# Patient Record
Sex: Male | Born: 1985 | Race: White | Hispanic: No | Marital: Single
Health system: Midwestern US, Academic
[De-identification: ages and names within clinical notes are randomized; demographics above are authoritative.]

---

## 2017-07-29 ENCOUNTER — Encounter: Admit: 2017-07-29 | Discharge: 2017-07-29

## 2017-07-29 DIAGNOSIS — K4090 Unilateral inguinal hernia, without obstruction or gangrene, not specified as recurrent: Principal | ICD-10-CM

## 2017-07-30 ENCOUNTER — Encounter: Admit: 2017-07-30 | Discharge: 2017-07-30

## 2017-07-30 ENCOUNTER — Emergency Department: Admit: 2017-07-30 | Discharge: 2017-07-30 | Disposition: A | Discharge status: Home / Self Care

## 2017-07-30 ENCOUNTER — Emergency Department: Admit: 2017-07-30 | Discharge: 2017-07-30

## 2017-07-30 DIAGNOSIS — K4090 Unilateral inguinal hernia, without obstruction or gangrene, not specified as recurrent: Principal | ICD-10-CM

## 2017-07-30 LAB — URINALYSIS DIPSTICK

## 2017-07-30 LAB — CBC AND DIFF
Lab: 0 % (ref >60)
Lab: 0 10*3/uL (ref 0–0.20)
Lab: 0.2 10*3/uL (ref 0–0.45)
Lab: 0.9 10*3/uL — ABNORMAL HIGH (ref 0–0.80)
Lab: 13 % (ref 11–15)
Lab: 13 % — ABNORMAL HIGH (ref 4–12)
Lab: 14 g/dL — ABNORMAL HIGH (ref 13.5–16.5)
Lab: 2 % (ref 0–5)
Lab: 2.1 10*3/uL (ref 1.0–4.8)
Lab: 257 K/UL (ref 150–400)
Lab: 29 pg (ref 26–34)
Lab: 32 g/dL (ref 32.0–36.0)
Lab: 4.2 10*3/uL (ref >60)
Lab: 4.8 M/UL (ref 4.4–5.5)
Lab: 44 % (ref 40–50)
Lab: 56 % (ref 41–77)
Lab: 7.4 10*3/uL — AB (ref 4.5–11.0)
Lab: 8 FL (ref 7–11)
Lab: 91 FL (ref 80–100)

## 2017-07-30 LAB — URINALYSIS, MICROSCOPIC

## 2017-07-30 LAB — COMPREHENSIVE METABOLIC PANEL: Lab: 139 MMOL/L (ref 137–147)

## 2017-07-30 LAB — POC LACTATE: Lab: 0.6 MMOL/L (ref 0.5–2.0)

## 2017-07-30 MED ORDER — OXYCODONE-ACETAMINOPHEN 5-325 MG PO TAB
1 | ORAL_TABLET | ORAL | 0 refills | 2.00000 days | Status: AC | PRN
Start: 2017-07-30 — End: 2017-09-11

## 2017-07-30 MED ORDER — SODIUM CHLORIDE 0.9 % IJ SOLN
50 mL | Freq: Once | INTRAVENOUS | 0 refills | Status: CP
Start: 2017-07-30 — End: ?
  Administered 2017-07-30: 10:00:00 50 mL via INTRAVENOUS

## 2017-07-30 MED ORDER — FENTANYL CITRATE (PF) 50 MCG/ML IJ SOLN
50 ug | Freq: Once | INTRAVENOUS | 0 refills | Status: CP
Start: 2017-07-30 — End: ?
  Administered 2017-07-30: 09:00:00 50 ug via INTRAVENOUS

## 2017-07-30 MED ORDER — IOPAMIDOL 76 % IV SOLN
100 mL | Freq: Once | INTRAVENOUS | 0 refills | Status: CP
Start: 2017-07-30 — End: ?
  Administered 2017-07-30: 10:00:00 100 mL via INTRAVENOUS

## 2017-07-30 NOTE — ED Notes
Pt to CT

## 2017-07-30 NOTE — ED Notes
Pt discharged home with instructions and prescriptions, IV removed. Pt is AOX4 at this time and has no questions about discharge. Pt ambulates without difficulty. Pt's mom is here to drive him home. Pt verbalizes understanding of not being able to drive for 24 hours after having narcotic pain medication.

## 2017-09-11 ENCOUNTER — Encounter: Admit: 2017-09-11 | Discharge: 2017-09-11

## 2017-09-11 ENCOUNTER — Ambulatory Visit: Admit: 2017-09-11 | Discharge: 2017-09-12

## 2017-09-11 DIAGNOSIS — K4090 Unilateral inguinal hernia, without obstruction or gangrene, not specified as recurrent: Principal | ICD-10-CM

## 2017-09-11 MED ORDER — CEFAZOLIN INJ 1GM IVP
2 g | Freq: Once | INTRAVENOUS | 0 refills | Status: CN
Start: 2017-09-11 — End: ?

## 2017-09-11 NOTE — Progress Notes
Date of Service: 09/11/2017    Subjective:          Micheal Lawrence is a 31 y.o. male.    History of Present Illness  Micheal Lawrence is a 31 yo male w/ no significant past medical hx here for surgical evaluation of right inguinal hernia. He first noticed it ~1 month ago when lifting a large tool box out of the back of his truck. He noticed a significant discomfort and pulling in his right groin though denies noticing a bulge. It has bothered him every day since and he has been doing heavy lifting but having to stop because of the pain. 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     Review of Systems   Constitutional: Positive for activity change. Negative for appetite change, chills, diaphoresis, fatigue and unexpected weight change.   HENT: Negative for congestion, dental problem, drooling, ear discharge, ear pain, facial swelling, hearing loss, mouth sores, nosebleeds, postnasal drip, rhinorrhea, sinus pressure, sneezing, sore throat, tinnitus, trouble swallowing and voice change.    Eyes: Negative for photophobia, pain, discharge, redness, itching and visual disturbance.   Respiratory: Negative for apnea, cough, choking, chest tightness, shortness of breath and wheezing.    Cardiovascular: Negative for chest pain, palpitations and leg swelling.   Gastrointestinal: Negative for abdominal distention, abdominal pain, anal bleeding, blood in stool, constipation, diarrhea, nausea and vomiting.   Genitourinary: Negative for decreased urine volume, difficulty urinating, discharge, dysuria, enuresis, flank pain, frequency, genital sores, hematuria, penile swelling, testicular pain and urgency.   Musculoskeletal: Positive for back pain. Negative for arthralgias, gait problem, joint swelling, myalgias, neck pain and neck stiffness.   Skin: Negative for color change, pallor, rash and wound. Neurological: Negative for dizziness, tremors, seizures, facial asymmetry, speech difficulty, weakness, light-headedness, numbness and headaches.   Hematological: Negative for adenopathy. Does not bruise/bleed easily.   Psychiatric/Behavioral: Negative for agitation, behavioral problems, confusion, decreased concentration, dysphoric mood, hallucinations, self-injury, sleep disturbance and suicidal ideas. The patient is not nervous/anxious and is not hyperactive.      History reviewed. No pertinent past medical history.  History reviewed. No pertinent surgical history.  History reviewed. No pertinent family history.  Social History     Socioeconomic History   ??? Marital status: Single     Spouse name: Not on file   ??? Number of children: Not on file   ??? Years of education: Not on file   ??? Highest education level: Not on file   Occupational History   ??? Not on file   Tobacco Use   ??? Smoking status: Current Every Day Smoker     Packs/day: 0.50     Years: 8.00     Pack years: 4.00     Types: Cigarettes   ??? Smokeless tobacco: Never Used   Substance and Sexual Activity   ??? Alcohol use: Yes     Frequency: 2-4 times a month     Comment: social   ??? Drug use: No   ??? Sexual activity: Not on file   Other Topics Concern   ??? Not on file   Social History Narrative   ??? Not on file     Objective:         No current outpatient medications on file.     Vitals:    09/11/17 1527   BP: 137/74   Pulse: 82   Resp: 16   Temp: 36.7 ???  C (98 ???F)   TempSrc: Oral   Weight: 86.6 kg (191 lb)   Height: 190.5 cm (75)     Body mass index is 23.87 kg/m???.     Physical Exam   Constitutional: He is oriented to person, place, and time. He appears well-developed and well-nourished. No distress.   HENT:   Head: Normocephalic and atraumatic.   Eyes: Conjunctivae and EOM are normal.   Neck: Normal range of motion.   Cardiovascular: Normal rate and regular rhythm.   Pulmonary/Chest: Effort normal and breath sounds normal. Abdominal: Soft. He exhibits no distension. A hernia is present. Hernia confirmed positive in the right inguinal area.   Genitourinary: Penis normal.   Genitourinary Comments: TTP   Musculoskeletal: Normal range of motion.   Neurological: He is alert and oriented to person, place, and time.   Skin: Skin is warm and dry.   Psychiatric: He has a normal mood and affect. His behavior is normal.     Assessment and Plan:  Micheal Lawrence is a 31 year old male with right inguinal hernia.     Discussed risks and benefits of surgery v doing nothing  Patient agreeable to proceed with right inguinal hernia repair  Discussed importance of not lifting heavily after the surgery and until that time  Scheduled for surgery 10/07/17    Boris Sharper, MD  Pager [phone]    ATTESTATION    I personally performed the key portions of the E/M visit, discussed case with resident and concur with resident documentation of history, physical exam, assessment, and treatment plan unless otherwise noted.     The procedure was described.  Risks, benefits, complications were described.  All questions were answered.   The patient wishes to proceed with surgery  Will schedule for surgery    Staff name:  Janine Ores, DO Date:  09/12/2017

## 2017-10-06 ENCOUNTER — Ambulatory Visit: Admit: 2017-10-06 | Discharge: 2017-10-06

## 2017-10-06 DIAGNOSIS — K4090 Unilateral inguinal hernia, without obstruction or gangrene, not specified as recurrent: Principal | ICD-10-CM

## 2017-10-07 ENCOUNTER — Encounter: Admit: 2017-10-07 | Discharge: 2017-10-07

## 2017-10-07 ENCOUNTER — Ambulatory Visit: Admit: 2017-10-07 | Discharge: 2017-10-07

## 2017-10-07 DIAGNOSIS — F17210 Nicotine dependence, cigarettes, uncomplicated: ICD-10-CM

## 2017-10-07 DIAGNOSIS — K4090 Unilateral inguinal hernia, without obstruction or gangrene, not specified as recurrent: Principal | ICD-10-CM

## 2017-10-07 DIAGNOSIS — M47812 Spondylosis without myelopathy or radiculopathy, cervical region: ICD-10-CM

## 2017-10-07 DIAGNOSIS — M419 Scoliosis, unspecified: ICD-10-CM

## 2017-10-07 DIAGNOSIS — M4850XA Collapsed vertebra, not elsewhere classified, site unspecified, initial encounter for fracture: Principal | ICD-10-CM

## 2017-10-07 MED ORDER — LIDOCAINE (PF) 200 MG/10 ML (2 %) IJ SYRG
0 refills | Status: DC
Start: 2017-10-07 — End: 2017-10-07
  Administered 2017-10-07: 16:00:00 100 mg via INTRAVENOUS

## 2017-10-07 MED ORDER — LIDOCAINE (PF) 10 MG/ML (1 %) IJ SOLN
.1-2 mL | INTRAMUSCULAR | 0 refills | Status: DC | PRN
Start: 2017-10-07 — End: 2017-10-07

## 2017-10-07 MED ORDER — FENTANYL CITRATE (PF) 50 MCG/ML IJ SOLN
50 ug | INTRAVENOUS | 0 refills | Status: DC | PRN
Start: 2017-10-07 — End: 2017-10-07

## 2017-10-07 MED ORDER — FENTANYL CITRATE (PF) 50 MCG/ML IJ SOLN
0 refills | Status: DC
Start: 2017-10-07 — End: 2017-10-07
  Administered 2017-10-07 (×2): 25 ug via INTRAVENOUS

## 2017-10-07 MED ORDER — MIDAZOLAM 1 MG/ML IJ SOLN
INTRAVENOUS | 0 refills | Status: DC
Start: 2017-10-07 — End: 2017-10-07
  Administered 2017-10-07: 16:00:00 2 mg via INTRAVENOUS

## 2017-10-07 MED ORDER — BUPIVACAINE 0.25 % (2.5 MG/ML) IJ SOLN
0 refills | Status: DC
Start: 2017-10-07 — End: 2017-10-07
  Administered 2017-10-07: 17:00:00 5 mL via INTRAMUSCULAR

## 2017-10-07 MED ORDER — DEXTRAN 70-HYPROMELLOSE (PF) 0.1-0.3 % OP DPET
0 refills | Status: DC
Start: 2017-10-07 — End: 2017-10-07
  Administered 2017-10-07: 16:00:00 2 [drp] via OPHTHALMIC

## 2017-10-07 MED ORDER — PROPOFOL INJ 10 MG/ML IV VIAL
0 refills | Status: DC
Start: 2017-10-07 — End: 2017-10-07
  Administered 2017-10-07: 16:00:00 300 mg via INTRAVENOUS

## 2017-10-07 MED ORDER — OXYCODONE 5 MG PO TAB
5-10 mg | Freq: Once | ORAL | 0 refills | Status: CP | PRN
Start: 2017-10-07 — End: ?
  Administered 2017-10-07: 18:00:00 10 mg via ORAL

## 2017-10-07 MED ORDER — FENTANYL CITRATE (PF) 50 MCG/ML IJ SOLN
25 ug | INTRAVENOUS | 0 refills | Status: DC | PRN
Start: 2017-10-07 — End: 2017-10-07
  Administered 2017-10-07 (×4): 25 ug via INTRAVENOUS

## 2017-10-07 MED ORDER — CEFAZOLIN INJ 1GM IVP
2 g | Freq: Once | INTRAVENOUS | 0 refills | Status: CP
Start: 2017-10-07 — End: ?
  Administered 2017-10-07: 16:00:00 2 g via INTRAVENOUS

## 2017-10-07 MED ORDER — LIDOCAINE HCL 20 MG/ML (2 %) IJ SOLN
0 refills | Status: DC
Start: 2017-10-07 — End: 2017-10-07
  Administered 2017-10-07: 17:00:00 5 mL via INTRAMUSCULAR

## 2017-10-07 MED ORDER — KETOROLAC 30 MG/ML (1 ML) IJ SOLN
30 mg | Freq: Once | INTRAVENOUS | 0 refills | Status: CP
Start: 2017-10-07 — End: ?
  Administered 2017-10-07: 18:00:00 30 mg via INTRAVENOUS

## 2017-10-07 MED ORDER — ONDANSETRON HCL (PF) 4 MG/2 ML IJ SOLN
INTRAVENOUS | 0 refills | Status: DC
Start: 2017-10-07 — End: 2017-10-07
  Administered 2017-10-07: 16:00:00 4 mg via INTRAVENOUS

## 2017-10-07 MED ORDER — HYDROMORPHONE (PF) 2 MG/ML IJ SYRG
.5-1 mg | INTRAVENOUS | 0 refills | Status: DC | PRN
Start: 2017-10-07 — End: 2017-10-07
  Administered 2017-10-07: 19:00:00 1 mg via INTRAVENOUS

## 2017-10-07 MED ORDER — ACETAMINOPHEN 1,000 MG/100 ML (10 MG/ML) IV SOLN
0 refills | Status: DC
Start: 2017-10-07 — End: 2017-10-07
  Administered 2017-10-07: 17:00:00 1000 mg via INTRAVENOUS

## 2017-10-07 MED ORDER — OXYCODONE 5 MG PO TAB
5 mg | ORAL_TABLET | ORAL | 0 refills | 6.00000 days | Status: AC | PRN
Start: 2017-10-07 — End: 2017-10-30
  Filled 2017-10-07: qty 20, 4d supply

## 2017-10-07 MED ORDER — PROMETHAZINE 25 MG/ML IJ SOLN
6.25 mg | INTRAVENOUS | 0 refills | Status: DC | PRN
Start: 2017-10-07 — End: 2017-10-07

## 2017-10-07 MED ORDER — LACTATED RINGERS IV SOLP
INTRAVENOUS | 0 refills | Status: DC
Start: 2017-10-07 — End: 2017-10-07
  Administered 2017-10-07 (×2): 1000.000 mL via INTRAVENOUS

## 2017-10-07 MED ORDER — DEXAMETHASONE SODIUM PHOSPHATE 4 MG/ML IJ SOLN
INTRAVENOUS | 0 refills | Status: DC
Start: 2017-10-07 — End: 2017-10-07
  Administered 2017-10-07: 16:00:00 4 mg via INTRAVENOUS

## 2017-10-07 NOTE — Interval H&P Note
History and Physical Update Note    Allergies:  Patient has no known allergies.    I have examined the patient, and the following changes are noted from the previous H&P performed on 09/11/17.    - To OR today for open right inguinal hernia repair with mesh  - Risk and benefits of operative procedure were discussed in detail with the patient, including, but not limited to, bleeding, infection, damage to surrounding structures, chronic pain, recurrence, and need for reoperation. After all questions were answered, the patient elected to proceed with surgery and informed consent was provided.    Janell Quiet, MD  Pager [phone]    --------------------------------------------------------------------------------------------------------------------------------------------

## 2017-10-07 NOTE — Other
Brief Operative Note    Name: Marckus Hanover is a 32 y.o. male     DOB: 11/21/1985             MRN#: [id number]  DATE OF OPERATION: 10/07/2017    Date:  10/07/2017        Preoperative Dx:   Right inguinal hernia [K40.90]    Post-op Diagnosis      * Right inguinal hernia [K40.90]    Procedure(s) (LRB):  OPEN REPAIR INGUINAL HERNIA AGE 35 YEARS OR OLDER - REDUCIBLE - WITH MESH PLACEMENT - RIGHT (Right)    Anesthesia Type: General    Surgeon(s) and Role:     * Kennedy Bucker, [place], DO - Primary     * Janell Quiet, MD - Resident - Assisting    Findings:  Right direct inguinal hernia, cord lipoma    Estimated Blood Loss: Minimal     Specimen(s) Removed/Disposition:   ID Type Source Tests Collected by Time Destination   1 : 1. RIGHT SIDED CORD LIPOMA Tissue Hernia SURGICAL PATHOLOGY          Janine Ores, DO 10/07/2017 1039        Complications:  None    Implants: Mesh    Drains: None    Disposition:  PACU - stable    Janell Quiet, MD  Pager [phone]

## 2017-10-09 ENCOUNTER — Encounter: Admit: 2017-10-09 | Discharge: 2017-10-09

## 2017-10-09 DIAGNOSIS — M419 Scoliosis, unspecified: ICD-10-CM

## 2017-10-09 DIAGNOSIS — M47812 Spondylosis without myelopathy or radiculopathy, cervical region: ICD-10-CM

## 2017-10-09 DIAGNOSIS — M4850XA Collapsed vertebra, not elsewhere classified, site unspecified, initial encounter for fracture: Principal | ICD-10-CM

## 2017-10-17 ENCOUNTER — Encounter: Admit: 2017-10-17 | Discharge: 2017-10-17

## 2017-10-30 ENCOUNTER — Encounter: Admit: 2017-10-30 | Discharge: 2017-10-30

## 2017-10-30 ENCOUNTER — Ambulatory Visit: Admit: 2017-10-30 | Discharge: 2017-10-30

## 2017-10-30 DIAGNOSIS — M419 Scoliosis, unspecified: ICD-10-CM

## 2017-10-30 DIAGNOSIS — M4850XA Collapsed vertebra, not elsewhere classified, site unspecified, initial encounter for fracture: Principal | ICD-10-CM

## 2017-10-30 DIAGNOSIS — M47812 Spondylosis without myelopathy or radiculopathy, cervical region: ICD-10-CM

## 2017-10-30 DIAGNOSIS — K4090 Unilateral inguinal hernia, without obstruction or gangrene, not specified as recurrent: Principal | ICD-10-CM

## 2017-11-27 ENCOUNTER — Ambulatory Visit: Admit: 2017-11-27 | Discharge: 2017-11-27

## 2017-11-27 ENCOUNTER — Encounter: Admit: 2017-11-27 | Discharge: 2017-11-27

## 2017-11-27 DIAGNOSIS — M47812 Spondylosis without myelopathy or radiculopathy, cervical region: ICD-10-CM

## 2017-11-27 DIAGNOSIS — M419 Scoliosis, unspecified: ICD-10-CM

## 2017-11-27 DIAGNOSIS — K4090 Unilateral inguinal hernia, without obstruction or gangrene, not specified as recurrent: Principal | ICD-10-CM

## 2017-11-27 DIAGNOSIS — M4850XA Collapsed vertebra, not elsewhere classified, site unspecified, initial encounter for fracture: Principal | ICD-10-CM

## 2017-12-26 ENCOUNTER — Encounter: Admit: 2017-12-26 | Discharge: 2017-12-26

## 2017-12-26 DIAGNOSIS — M47812 Spondylosis without myelopathy or radiculopathy, cervical region: ICD-10-CM

## 2017-12-26 DIAGNOSIS — M4850XA Collapsed vertebra, not elsewhere classified, site unspecified, initial encounter for fracture: Principal | ICD-10-CM

## 2017-12-26 DIAGNOSIS — M419 Scoliosis, unspecified: ICD-10-CM

## 2018-08-06 ENCOUNTER — Inpatient Hospital Stay: Admit: 2018-08-06 | Discharge: 2018-08-06

## 2018-08-06 ENCOUNTER — Encounter: Admit: 2018-08-06 | Discharge: 2018-08-06

## 2018-08-06 ENCOUNTER — Emergency Department: Admit: 2018-08-06 | Discharge: 2018-08-06 | Attending: Critical Care Medicine

## 2018-08-06 ENCOUNTER — Emergency Department: Admit: 2018-08-06 | Discharge: 2018-08-07 | Attending: Critical Care Medicine

## 2018-08-06 ENCOUNTER — Inpatient Hospital Stay
Admit: 2018-08-06 | Discharge: 2018-08-09 | Disposition: A | Discharge status: Home / Self Care | Payer: Private Health Insurance - Indemnity | Attending: Critical Care Medicine | Admitting: Critical Care Medicine

## 2018-08-06 ENCOUNTER — Ambulatory Visit: Admit: 2018-08-06 | Discharge: 2018-08-06

## 2018-08-06 DIAGNOSIS — M79605 Pain in left leg: ICD-10-CM

## 2018-08-06 DIAGNOSIS — Z7409 Other reduced mobility: Secondary | ICD-10-CM

## 2018-08-06 LAB — BASIC METABOLIC PANEL
Lab: 1.1 mg/dL (ref 0.4–1.24)
Lab: 139 MMOL/L (ref 137–147)
Lab: 18 mg/dL (ref 7–25)
Lab: 183 mg/dL — ABNORMAL HIGH (ref 70–100)
Lab: 6 % (ref 3–12)
Lab: 60 mL/min — ABNORMAL LOW (ref >60)
Lab: 8.6 mg/dL (ref 8.5–10.6)
Lab: >60 mL/min (ref >60)

## 2018-08-06 LAB — ALCOHOL LEVEL: Lab: <10 mg/dL (ref 21–30)

## 2018-08-06 LAB — PTT (APTT): Lab: 25 s (ref 24.0–36.5)

## 2018-08-06 LAB — PROTIME INR (PT): Lab: 1 M/UL (ref 0.8–1.2)

## 2018-08-06 LAB — CBC: Lab: 15 10*3/uL — ABNORMAL HIGH (ref 4.5–11.0)

## 2018-08-06 LAB — LACTIC ACID (BG - RAPID LACTATE): Lab: 1.5 MMOL/L (ref 0.5–2.0)

## 2018-08-06 LAB — CREATINE KINASE-CPK: Lab: 510 U/L — ABNORMAL HIGH (ref 35–232)

## 2018-08-06 MED ORDER — FENTANYL CITRATE (PF) 50 MCG/ML IJ SOLN
25-50 ug | INTRAVENOUS | 0 refills | Status: DC | PRN
Start: 2018-08-06 — End: 2018-08-07
  Administered 2018-08-06: 17:00:00 50 ug via INTRAVENOUS

## 2018-08-06 MED ORDER — LIDOCAINE (PF) 200 MG/10 ML (2 %) IJ SYRG
0 refills | Status: DC
Start: 2018-08-06 — End: 2018-08-06
  Administered 2018-08-06: 21:00:00 80 mg via INTRAVENOUS

## 2018-08-06 MED ORDER — FENTANYL CITRATE (PF) 50 MCG/ML IJ SOLN
25 ug | INTRAVENOUS | 0 refills | Status: DC | PRN
Start: 2018-08-06 — End: 2018-08-07

## 2018-08-06 MED ORDER — ONDANSETRON HCL (PF) 4 MG/2 ML IJ SOLN
INTRAVENOUS | 0 refills | Status: DC
Start: 2018-08-06 — End: 2018-08-06
  Administered 2018-08-06: 22:00:00 4 mg via INTRAVENOUS

## 2018-08-06 MED ORDER — OXYCODONE 5 MG PO TAB
5-10 mg | Freq: Once | ORAL | 0 refills | Status: CP | PRN
Start: 2018-08-06 — End: ?
  Administered 2018-08-07: 01:00:00 10 mg via ORAL

## 2018-08-06 MED ORDER — AMPICILLIN/SULBACTAM 3G/100ML NS IVPB (MB+)
3 g | INTRAVENOUS | 0 refills | Status: CP
Start: 2018-08-06 — End: ?
  Administered 2018-08-06 – 2018-08-07 (×6): 3 g via INTRAVENOUS

## 2018-08-06 MED ORDER — ONDANSETRON HCL (PF) 4 MG/2 ML IJ SOLN
4 mg | INTRAVENOUS | 0 refills | Status: DC | PRN
Start: 2018-08-06 — End: 2018-08-09

## 2018-08-06 MED ORDER — PROMETHAZINE 25 MG/ML IJ SOLN
6.25 mg | INTRAVENOUS | 0 refills | Status: DC | PRN
Start: 2018-08-06 — End: 2018-08-07

## 2018-08-06 MED ORDER — FENTANYL CITRATE (PF) 50 MCG/ML IJ SOLN
50 ug | Freq: Once | INTRAVENOUS | 0 refills | Status: CP
Start: 2018-08-06 — End: ?
  Administered 2018-08-06: 15:00:00 50 ug via INTRAVENOUS

## 2018-08-06 MED ORDER — SODIUM CHLORIDE 0.9 % IJ SOLN
50 mL | Freq: Once | INTRAVENOUS | 0 refills | Status: CP
Start: 2018-08-06 — End: ?
  Administered 2018-08-06: 15:00:00 50 mL via INTRAVENOUS

## 2018-08-06 MED ORDER — FENTANYL CITRATE (PF) 50 MCG/ML IJ SOLN
0 refills | Status: DC
Start: 2018-08-06 — End: 2018-08-06
  Administered 2018-08-06: 21:00:00 100 ug via INTRAVENOUS

## 2018-08-06 MED ORDER — DEXAMETHASONE SODIUM PHOSPHATE 4 MG/ML IJ SOLN
4 mg | Freq: Once | INTRAVENOUS | 0 refills | Status: AC | PRN
Start: 2018-08-06 — End: ?

## 2018-08-06 MED ORDER — HYDROMORPHONE (PF) 2 MG/ML IJ SYRG
1 mg | Freq: Once | INTRAVENOUS | 0 refills | Status: CP
Start: 2018-08-06 — End: ?
  Administered 2018-08-06: 18:00:00 1 mg via INTRAVENOUS

## 2018-08-06 MED ORDER — MIDAZOLAM 1 MG/ML IJ SOLN
INTRAVENOUS | 0 refills | Status: DC
Start: 2018-08-06 — End: 2018-08-06
  Administered 2018-08-06: 21:00:00 2 mg via INTRAVENOUS

## 2018-08-06 MED ORDER — IOHEXOL 350 MG IODINE/ML IV SOLN
100 mL | Freq: Once | INTRAVENOUS | 0 refills | Status: CP
Start: 2018-08-06 — End: ?
  Administered 2018-08-06: 15:00:00 100 mL via INTRAVENOUS

## 2018-08-06 MED ORDER — MEPERIDINE (PF) 25 MG/ML IJ SYRG
12.5 mg | INTRAVENOUS | 0 refills | Status: DC | PRN
Start: 2018-08-06 — End: 2018-08-07
  Administered 2018-08-07: 01:00:00 12.5 mg via INTRAVENOUS

## 2018-08-06 MED ORDER — ENOXAPARIN 30 MG/0.3 ML SC SYRG
30 mg | Freq: Two times a day (BID) | SUBCUTANEOUS | 0 refills | Status: DC
Start: 2018-08-06 — End: 2018-08-09
  Administered 2018-08-07 – 2018-08-08 (×3): 30 mg via SUBCUTANEOUS

## 2018-08-06 MED ORDER — HYDROMORPHONE (PF) 2 MG/ML IJ SYRG
0 refills | Status: DC
Start: 2018-08-06 — End: 2018-08-06
  Administered 2018-08-06: 22:00:00 .4 mg via INTRAVENOUS
  Administered 2018-08-06 (×2): .2 mg via INTRAVENOUS

## 2018-08-06 MED ORDER — SODIUM CHLORIDE 0.9 % IV SOLP
1000 mL | INTRAVENOUS | 0 refills | Status: DC
Start: 2018-08-06 — End: 2018-08-07
  Administered 2018-08-06 – 2018-08-07 (×2): 1000 mL via INTRAVENOUS

## 2018-08-06 MED ORDER — DEXTRAN 70-HYPROMELLOSE (PF) 0.1-0.3 % OP DPET
0 refills | Status: DC
Start: 2018-08-06 — End: 2018-08-06
  Administered 2018-08-06: 21:00:00 2 [drp] via OPHTHALMIC

## 2018-08-06 MED ORDER — OXYCODONE 5 MG PO TAB
5-15 mg | ORAL | 0 refills | Status: DC | PRN
Start: 2018-08-06 — End: 2018-08-09
  Administered 2018-08-07 (×2): 15 mg via ORAL
  Administered 2018-08-07: 07:00:00 10 mg via ORAL
  Administered 2018-08-07: 03:00:00 5 mg via ORAL
  Administered 2018-08-07 – 2018-08-09 (×7): 15 mg via ORAL

## 2018-08-06 MED ORDER — LACTATED RINGERS IV SOLP
INTRAVENOUS | 0 refills | Status: DC
Start: 2018-08-06 — End: 2018-08-07
  Administered 2018-08-06: 20:00:00 1000.000 mL via INTRAVENOUS

## 2018-08-06 MED ORDER — PROPOFOL INJ 10 MG/ML IV VIAL
0 refills | Status: DC
Start: 2018-08-06 — End: 2018-08-06
  Administered 2018-08-06: 21:00:00 200 mg via INTRAVENOUS

## 2018-08-06 MED ORDER — SUCCINYLCHOLINE CHLORIDE 20 MG/ML IJ SOLN
INTRAVENOUS | 0 refills | Status: DC
Start: 2018-08-06 — End: 2018-08-06
  Administered 2018-08-06: 21:00:00 120 mg via INTRAVENOUS

## 2018-08-06 MED ORDER — DEXAMETHASONE SODIUM PHOSPHATE 4 MG/ML IJ SOLN
INTRAVENOUS | 0 refills | Status: DC
Start: 2018-08-06 — End: 2018-08-06
  Administered 2018-08-06: 21:00:00 4 mg via INTRAVENOUS

## 2018-08-07 LAB — CBC
Lab: 12 g/dL — ABNORMAL LOW (ref 13.5–16.5)
Lab: 13 % (ref 11–15)
Lab: 210 10*3/uL (ref 150–400)
Lab: 30 pg (ref 26–34)
Lab: 33 g/dL (ref 32.0–36.0)
Lab: 38 % — ABNORMAL LOW (ref 40–50)
Lab: 4.2 M/UL — ABNORMAL LOW (ref 4.4–5.5)
Lab: 7.7 FL (ref 7–11)
Lab: 89 FL (ref 80–100)
Lab: 9.1 10*3/uL (ref 4.5–11.0)

## 2018-08-07 MED ORDER — SENNOSIDES-DOCUSATE SODIUM 8.6-50 MG PO TAB
1 | Freq: Two times a day (BID) | ORAL | 0 refills | Status: DC
Start: 2018-08-07 — End: 2018-08-09
  Administered 2018-08-07 – 2018-08-08 (×3): 1 via ORAL

## 2018-08-07 MED ORDER — METHOCARBAMOL 500 MG PO TAB
750 mg | Freq: Two times a day (BID) | ORAL | 0 refills | Status: DC
Start: 2018-08-07 — End: 2018-08-09
  Administered 2018-08-07 – 2018-08-08 (×3): 750 mg via ORAL

## 2018-08-07 MED ORDER — POLYETHYLENE GLYCOL 3350 17 GRAM PO PWPK
1 | Freq: Every day | ORAL | 0 refills | Status: DC
Start: 2018-08-07 — End: 2018-08-09
  Administered 2018-08-07 – 2018-08-08 (×2): 17 g via ORAL

## 2018-08-07 MED ORDER — ACETAMINOPHEN 325 MG PO TAB
650 mg | ORAL | 0 refills | Status: DC | PRN
Start: 2018-08-07 — End: 2018-08-09
  Administered 2018-08-07 – 2018-08-08 (×2): 650 mg via ORAL

## 2018-08-07 MED ORDER — FLU VACC QS2019-20 6MOS UP(PF) 60 MCG (15 MCG X 4)/0.5 ML IM SYRG
.5 mL | Freq: Once | INTRAMUSCULAR | 0 refills | Status: CP
Start: 2018-08-07 — End: ?
  Administered 2018-08-08: 21:00:00 0.5 mL via INTRAMUSCULAR

## 2018-08-08 ENCOUNTER — Encounter: Admit: 2018-08-08 | Discharge: 2018-08-08

## 2018-08-08 LAB — MAGNESIUM: Lab: 1.8 mg/dL — ABNORMAL LOW (ref 1.6–2.6)

## 2018-08-08 LAB — PHOSPHORUS: Lab: 3 mg/dL — ABNORMAL LOW (ref >60)

## 2018-08-08 LAB — CBC: Lab: 6.6 10*3/uL — ABNORMAL HIGH (ref 4.5–11.0)

## 2018-08-08 LAB — BASIC METABOLIC PANEL: Lab: 139 MMOL/L — ABNORMAL LOW (ref 137–147)

## 2018-08-08 MED ORDER — OXYCODONE 5 MG PO TAB
5-10 mg | ORAL_TABLET | ORAL | 0 refills | 6.00000 days | Status: AC | PRN
Start: 2018-08-08 — End: 2018-08-26
  Filled 2018-08-08 (×2): qty 50, 5d supply, fill #1

## 2018-08-08 MED ORDER — METHOCARBAMOL 750 MG PO TAB
750 mg | ORAL_TABLET | Freq: Two times a day (BID) | ORAL | 0 refills | Status: AC
Start: 2018-08-08 — End: 2018-08-27
  Filled 2018-08-08 (×2): qty 30, 15d supply, fill #1

## 2018-08-08 MED ORDER — ACETAMINOPHEN 325 MG PO TAB
650 mg | ORAL | 0 refills | Status: DC | PRN
Start: 2018-08-08 — End: 2019-11-30

## 2018-08-08 MED ORDER — POLYETHYLENE GLYCOL 3350 17 GRAM PO PWPK
17 g | Freq: Every day | ORAL | 0 refills | 18.00000 days | Status: DC
Start: 2018-08-08 — End: 2019-11-30

## 2018-08-08 MED ORDER — SENNOSIDES-DOCUSATE SODIUM 8.6-50 MG PO TAB
1 | Freq: Two times a day (BID) | ORAL | 0 refills | Status: DC
Start: 2018-08-08 — End: 2019-11-10

## 2018-08-08 MED ORDER — ENOXAPARIN 40 MG/0.4 ML SC SYRG
40 mg | Freq: Every day | SUBCUTANEOUS | 0 refills | 7.00000 days | Status: AC
Start: 2018-08-08 — End: ?
  Filled 2018-08-08 (×2): qty 21, 21d supply, fill #1

## 2018-08-09 ENCOUNTER — Emergency Department: Admit: 2018-08-06 | Discharge: 2018-08-06 | Attending: Critical Care Medicine

## 2018-08-09 ENCOUNTER — Inpatient Hospital Stay: Admit: 2018-08-06 | Discharge: 2018-08-06

## 2018-08-09 ENCOUNTER — Emergency Department: Admit: 2018-08-06 | Discharge: 2018-08-07 | Attending: Critical Care Medicine

## 2018-08-09 DIAGNOSIS — R40241 Glasgow coma scale score 13-15, unspecified time: ICD-10-CM

## 2018-08-09 DIAGNOSIS — S82492C Other fracture of shaft of left fibula, initial encounter for open fracture type IIIA, IIIB, or IIIC: Principal | ICD-10-CM

## 2018-08-09 DIAGNOSIS — Z23 Encounter for immunization: ICD-10-CM

## 2018-08-09 DIAGNOSIS — F1590 Other stimulant use, unspecified, uncomplicated: ICD-10-CM

## 2018-08-09 DIAGNOSIS — D72829 Elevated white blood cell count, unspecified: ICD-10-CM

## 2018-08-09 DIAGNOSIS — S92355A Nondisplaced fracture of fifth metatarsal bone, left foot, initial encounter for closed fracture: ICD-10-CM

## 2018-08-09 DIAGNOSIS — S92345A Nondisplaced fracture of fourth metatarsal bone, left foot, initial encounter for closed fracture: ICD-10-CM

## 2018-08-09 DIAGNOSIS — S27321A Contusion of lung, unilateral, initial encounter: ICD-10-CM

## 2018-08-09 DIAGNOSIS — Z7409 Other reduced mobility: ICD-10-CM

## 2018-08-09 DIAGNOSIS — G8911 Acute pain due to trauma: ICD-10-CM

## 2018-08-15 ENCOUNTER — Encounter: Admit: 2018-08-15 | Discharge: 2018-08-15

## 2018-08-15 DIAGNOSIS — M419 Scoliosis, unspecified: ICD-10-CM

## 2018-08-15 DIAGNOSIS — M47812 Spondylosis without myelopathy or radiculopathy, cervical region: ICD-10-CM

## 2018-08-15 DIAGNOSIS — M4850XA Collapsed vertebra, not elsewhere classified, site unspecified, initial encounter for fracture: Principal | ICD-10-CM

## 2018-08-26 ENCOUNTER — Ambulatory Visit: Admit: 2018-08-26 | Discharge: 2018-08-27 | Payer: BC Managed Care – PPO

## 2018-08-26 ENCOUNTER — Encounter: Admit: 2018-08-26 | Discharge: 2018-08-26

## 2018-08-26 DIAGNOSIS — M419 Scoliosis, unspecified: ICD-10-CM

## 2018-08-26 DIAGNOSIS — M4850XA Collapsed vertebra, not elsewhere classified, site unspecified, initial encounter for fracture: Principal | ICD-10-CM

## 2018-08-26 DIAGNOSIS — S82452F Displaced comminuted fracture of shaft of left fibula, subsequent encounter for open fracture type IIIA, IIIB, or IIIC with routine healing: Principal | ICD-10-CM

## 2018-08-26 DIAGNOSIS — M47812 Spondylosis without myelopathy or radiculopathy, cervical region: ICD-10-CM

## 2018-08-26 MED ORDER — OXYCODONE 5 MG PO TAB
5-10 mg | ORAL_TABLET | ORAL | 0 refills | 6.00000 days | Status: AC | PRN
Start: 2018-08-26 — End: 2018-09-02

## 2018-08-27 ENCOUNTER — Encounter: Admit: 2018-08-27 | Discharge: 2018-08-27

## 2018-08-27 MED ORDER — METHOCARBAMOL 750 MG PO TAB
750 mg | ORAL_TABLET | Freq: Two times a day (BID) | ORAL | 0 refills | Status: AC
Start: 2018-08-27 — End: 2019-11-30

## 2018-09-01 ENCOUNTER — Encounter: Admit: 2018-09-01 | Discharge: 2018-09-01

## 2018-09-01 DIAGNOSIS — S82452F Displaced comminuted fracture of shaft of left fibula, subsequent encounter for open fracture type IIIA, IIIB, or IIIC with routine healing: Principal | ICD-10-CM

## 2018-09-02 ENCOUNTER — Encounter: Admit: 2018-09-02 | Discharge: 2018-09-02

## 2018-09-02 ENCOUNTER — Ambulatory Visit: Admit: 2018-09-02 | Discharge: 2018-09-02

## 2018-09-02 DIAGNOSIS — S82452F Displaced comminuted fracture of shaft of left fibula, subsequent encounter for open fracture type IIIA, IIIB, or IIIC with routine healing: Principal | ICD-10-CM

## 2018-09-02 DIAGNOSIS — M4850XA Collapsed vertebra, not elsewhere classified, site unspecified, initial encounter for fracture: Principal | ICD-10-CM

## 2018-09-02 DIAGNOSIS — M419 Scoliosis, unspecified: ICD-10-CM

## 2018-09-02 DIAGNOSIS — M47812 Spondylosis without myelopathy or radiculopathy, cervical region: ICD-10-CM

## 2018-09-16 ENCOUNTER — Encounter: Admit: 2018-09-16 | Discharge: 2018-09-16

## 2018-09-16 DIAGNOSIS — S82452F Displaced comminuted fracture of shaft of left fibula, subsequent encounter for open fracture type IIIA, IIIB, or IIIC with routine healing: Principal | ICD-10-CM

## 2018-09-18 ENCOUNTER — Encounter: Admit: 2018-09-18 | Discharge: 2018-09-18

## 2018-09-18 ENCOUNTER — Observation Stay: Admit: 2018-09-18 | Discharge: 2018-09-19 | Payer: BC Managed Care – PPO

## 2018-09-18 ENCOUNTER — Ambulatory Visit: Admit: 2018-09-18 | Discharge: 2018-09-18

## 2018-09-18 DIAGNOSIS — M47812 Spondylosis without myelopathy or radiculopathy, cervical region: ICD-10-CM

## 2018-09-18 DIAGNOSIS — R609 Edema, unspecified: ICD-10-CM

## 2018-09-18 DIAGNOSIS — M4850XA Collapsed vertebra, not elsewhere classified, site unspecified, initial encounter for fracture: Principal | ICD-10-CM

## 2018-09-18 DIAGNOSIS — M419 Scoliosis, unspecified: ICD-10-CM

## 2018-09-18 MED ORDER — METHOCARBAMOL 750 MG PO TAB
750 mg | Freq: Two times a day (BID) | ORAL | 0 refills | Status: DC
Start: 2018-09-18 — End: 2018-09-19
  Administered 2018-09-19 (×2): 750 mg via ORAL

## 2018-09-18 MED ORDER — ENOXAPARIN 100 MG/ML SC SYRG
90 mg | Freq: Two times a day (BID) | SUBCUTANEOUS | 0 refills | Status: DC
Start: 2018-09-18 — End: 2018-09-19
  Administered 2018-09-18 – 2018-09-19 (×2): 90 mg via SUBCUTANEOUS

## 2018-09-18 MED ORDER — ONDANSETRON HCL (PF) 4 MG/2 ML IJ SOLN
4 mg | INTRAVENOUS | 0 refills | Status: DC | PRN
Start: 2018-09-18 — End: 2018-09-19

## 2018-09-18 MED ORDER — MAGNESIUM HYDROXIDE 2,400 MG/10 ML PO SUSP
10 mL | Freq: Every day | ORAL | 0 refills | Status: DC
Start: 2018-09-18 — End: 2018-09-19
  Administered 2018-09-19: 02:00:00 10 mL via ORAL

## 2018-09-18 MED ORDER — ACETAMINOPHEN 325 MG PO TAB
650 mg | ORAL | 0 refills | Status: DC | PRN
Start: 2018-09-18 — End: 2018-09-19

## 2018-09-18 MED ORDER — ACETAMINOPHEN 650 MG RE SUPP
650 mg | RECTAL | 0 refills | Status: DC | PRN
Start: 2018-09-18 — End: 2018-09-19

## 2018-09-18 MED ORDER — DIPHENHYDRAMINE HCL 50 MG/ML IJ SOLN
25 mg | INTRAVENOUS | 0 refills | Status: DC | PRN
Start: 2018-09-18 — End: 2018-09-19

## 2018-09-18 MED ORDER — DIPHENHYDRAMINE HCL 25 MG PO CAP
25 mg | ORAL | 0 refills | Status: DC | PRN
Start: 2018-09-18 — End: 2018-09-19

## 2018-09-18 MED ORDER — ENOXAPARIN 300 MG/3 ML SC SOLN
85 mg | Freq: Two times a day (BID) | SUBCUTANEOUS | 0 refills | Status: DC
Start: 2018-09-18 — End: 2018-09-18

## 2018-09-18 MED ORDER — BISACODYL 10 MG RE SUPP
10 mg | Freq: Every day | RECTAL | 0 refills | Status: DC | PRN
Start: 2018-09-18 — End: 2018-09-19

## 2018-09-18 MED ORDER — DOCUSATE SODIUM 100 MG PO CAP
100 mg | Freq: Two times a day (BID) | ORAL | 0 refills | Status: DC
Start: 2018-09-18 — End: 2018-09-19
  Administered 2018-09-18 – 2018-09-19 (×2): 100 mg via ORAL

## 2018-09-18 MED ORDER — TRAMADOL 50 MG PO TAB
50 mg | ORAL | 0 refills | Status: DC | PRN
Start: 2018-09-18 — End: 2018-09-19

## 2018-09-19 ENCOUNTER — Encounter: Admit: 2018-09-19 | Discharge: 2018-09-19

## 2018-09-19 ENCOUNTER — Ambulatory Visit: Admit: 2018-09-18 | Discharge: 2018-09-18

## 2018-09-19 DIAGNOSIS — F1290 Cannabis use, unspecified, uncomplicated: ICD-10-CM

## 2018-09-19 DIAGNOSIS — S82402D Unspecified fracture of shaft of left fibula, subsequent encounter for closed fracture with routine healing: ICD-10-CM

## 2018-09-19 DIAGNOSIS — Z79899 Other long term (current) drug therapy: ICD-10-CM

## 2018-09-19 DIAGNOSIS — F1590 Other stimulant use, unspecified, uncomplicated: ICD-10-CM

## 2018-09-19 DIAGNOSIS — S82452F Displaced comminuted fracture of shaft of left fibula, subsequent encounter for open fracture type IIIA, IIIB, or IIIC with routine healing: ICD-10-CM

## 2018-09-19 DIAGNOSIS — S92912D Unspecified fracture of left toe(s), subsequent encounter for fracture with routine healing: ICD-10-CM

## 2018-09-19 DIAGNOSIS — M419 Scoliosis, unspecified: ICD-10-CM

## 2018-09-19 DIAGNOSIS — I82432 Acute embolism and thrombosis of left popliteal vein: ICD-10-CM

## 2018-09-19 DIAGNOSIS — Z0489 Encounter for examination and observation for other specified reasons: ICD-10-CM

## 2018-09-19 DIAGNOSIS — I82452 Acute embolism and thrombosis of left peroneal vein: Principal | ICD-10-CM

## 2018-09-19 DIAGNOSIS — Z87828 Personal history of other (healed) physical injury and trauma: ICD-10-CM

## 2018-09-19 DIAGNOSIS — F17210 Nicotine dependence, cigarettes, uncomplicated: ICD-10-CM

## 2018-09-19 LAB — PROTIME INR (PT): Lab: 1 mg/dL — CL (ref >60)

## 2018-09-19 LAB — PTT (APTT): Lab: 29 s — ABNORMAL LOW (ref 24.0–36.5)

## 2018-09-19 LAB — BASIC METABOLIC PANEL
Lab: 140 MMOL/L — ABNORMAL LOW (ref 137–147)
Lab: 26 MMOL/L — ABNORMAL HIGH (ref 21–30)

## 2018-09-19 LAB — CBC AND DIFF: Lab: 5.8 K/UL — ABNORMAL LOW (ref >60)

## 2018-09-19 MED ORDER — RIVAROXABAN 15 MG PO TAB
15 mg | ORAL_TABLET | Freq: Two times a day (BID) | ORAL | 0 refills | 30.00000 days | Status: AC
Start: 2018-09-19 — End: 2018-10-08
  Filled 2018-09-19: qty 42, 14d supply, fill #2
  Filled 2018-09-19 (×2): qty 42, 7d supply, fill #1
  Filled 2018-09-19: qty 42, 14d supply, fill #2

## 2018-09-19 MED ORDER — TRAMADOL 50 MG PO TAB
50 mg | ORAL_TABLET | ORAL | 0 refills | Status: AC | PRN
Start: 2018-09-19 — End: 2019-11-30
  Filled 2018-09-19 (×2): qty 10, 3d supply, fill #1

## 2018-09-19 MED ORDER — RIVAROXABAN 20 MG PO TAB
20 mg | ORAL_TABLET | Freq: Every day | ORAL | 0 refills | 30.00000 days | Status: AC
Start: 2018-09-19 — End: 2018-10-08

## 2018-09-21 ENCOUNTER — Encounter: Admit: 2018-09-21 | Discharge: 2018-09-21

## 2018-10-01 ENCOUNTER — Encounter: Admit: 2018-10-01 | Discharge: 2018-10-01

## 2018-10-01 DIAGNOSIS — S82452F Displaced comminuted fracture of shaft of left fibula, subsequent encounter for open fracture type IIIA, IIIB, or IIIC with routine healing: Secondary | ICD-10-CM

## 2018-10-02 ENCOUNTER — Encounter: Admit: 2018-10-02 | Discharge: 2018-10-02

## 2018-10-02 ENCOUNTER — Ambulatory Visit: Admit: 2018-10-02 | Discharge: 2018-10-02 | Payer: BC Managed Care – PPO

## 2018-10-02 ENCOUNTER — Ambulatory Visit: Admit: 2018-10-02 | Discharge: 2018-10-02

## 2018-10-02 DIAGNOSIS — S92902D Unspecified fracture of left foot, subsequent encounter for fracture with routine healing: Secondary | ICD-10-CM

## 2018-10-02 DIAGNOSIS — M419 Scoliosis, unspecified: Secondary | ICD-10-CM

## 2018-10-02 DIAGNOSIS — M47812 Spondylosis without myelopathy or radiculopathy, cervical region: Secondary | ICD-10-CM

## 2018-10-02 DIAGNOSIS — S82452F Displaced comminuted fracture of shaft of left fibula, subsequent encounter for open fracture type IIIA, IIIB, or IIIC with routine healing: Secondary | ICD-10-CM

## 2018-10-02 DIAGNOSIS — S82422D Displaced transverse fracture of shaft of left fibula, subsequent encounter for closed fracture with routine healing: Secondary | ICD-10-CM

## 2018-10-02 DIAGNOSIS — M4850XA Collapsed vertebra, not elsewhere classified, site unspecified, initial encounter for fracture: Secondary | ICD-10-CM

## 2018-10-08 ENCOUNTER — Encounter: Admit: 2018-10-08 | Discharge: 2018-10-08

## 2018-10-08 MED ORDER — RIVAROXABAN 20 MG PO TAB
20 mg | ORAL_TABLET | Freq: Every day | ORAL | 0 refills | 30.00000 days | Status: AC
Start: 2018-10-08 — End: ?

## 2018-11-12 ENCOUNTER — Encounter: Admit: 2018-11-12 | Discharge: 2018-11-12

## 2018-11-12 DIAGNOSIS — S92902D Unspecified fracture of left foot, subsequent encounter for fracture with routine healing: Principal | ICD-10-CM

## 2018-11-12 DIAGNOSIS — S82422D Displaced transverse fracture of shaft of left fibula, subsequent encounter for closed fracture with routine healing: Principal | ICD-10-CM

## 2018-11-20 ENCOUNTER — Ambulatory Visit: Admit: 2018-11-20 | Discharge: 2018-11-20

## 2018-11-20 ENCOUNTER — Ambulatory Visit: Admit: 2018-11-20 | Discharge: 2018-11-20 | Payer: BC Managed Care – PPO

## 2018-11-20 ENCOUNTER — Encounter: Admit: 2018-11-20 | Discharge: 2018-11-20

## 2018-11-20 DIAGNOSIS — M419 Scoliosis, unspecified: ICD-10-CM

## 2018-11-20 DIAGNOSIS — M47812 Spondylosis without myelopathy or radiculopathy, cervical region: ICD-10-CM

## 2018-11-20 DIAGNOSIS — S82422D Displaced transverse fracture of shaft of left fibula, subsequent encounter for closed fracture with routine healing: ICD-10-CM

## 2018-11-20 DIAGNOSIS — S92902D Unspecified fracture of left foot, subsequent encounter for fracture with routine healing: Principal | ICD-10-CM

## 2018-11-20 DIAGNOSIS — M4850XA Collapsed vertebra, not elsewhere classified, site unspecified, initial encounter for fracture: Principal | ICD-10-CM

## 2018-12-17 ENCOUNTER — Encounter: Admit: 2018-12-17 | Discharge: 2018-12-17

## 2018-12-17 NOTE — Telephone Encounter
Pt called requesting to rtw full duty. Work status reflecting this emailed to pt [email or web address].

## 2019-01-16 ENCOUNTER — Encounter: Admit: 2019-01-16 | Discharge: 2019-01-16

## 2019-01-16 NOTE — Telephone Encounter
Attempted to call pt to set up long distance xrays/telehealth visit Tuesday w/ Dr. Cherene Julian but pt phone no answer and states mailbox is full, cannot leave message. Will attempt to cb pt later today/monday.

## 2019-01-19 ENCOUNTER — Encounter: Admit: 2019-01-19 | Discharge: 2019-01-19

## 2019-01-19 NOTE — Telephone Encounter
Performed travel screening with patient.

## 2019-01-20 ENCOUNTER — Encounter: Admit: 2019-01-20 | Discharge: 2019-01-20

## 2019-01-20 ENCOUNTER — Ambulatory Visit: Admit: 2019-01-20 | Discharge: 2019-01-20

## 2019-01-20 ENCOUNTER — Ambulatory Visit: Admit: 2019-01-20 | Discharge: 2019-01-20 | Payer: BC Managed Care – PPO

## 2019-01-20 DIAGNOSIS — M419 Scoliosis, unspecified: ICD-10-CM

## 2019-01-20 DIAGNOSIS — S92902D Unspecified fracture of left foot, subsequent encounter for fracture with routine healing: Principal | ICD-10-CM

## 2019-01-20 DIAGNOSIS — S82422D Displaced transverse fracture of shaft of left fibula, subsequent encounter for closed fracture with routine healing: ICD-10-CM

## 2019-01-20 DIAGNOSIS — M47812 Spondylosis without myelopathy or radiculopathy, cervical region: ICD-10-CM

## 2019-01-20 DIAGNOSIS — M4850XA Collapsed vertebra, not elsewhere classified, site unspecified, initial encounter for fracture: Principal | ICD-10-CM
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
## 2019-11-09 ENCOUNTER — Encounter: Admit: 2019-11-09 | Discharge: 2019-11-09

## 2019-11-09 DIAGNOSIS — S92902D Unspecified fracture of left foot, subsequent encounter for fracture with routine healing: Secondary | ICD-10-CM

## 2019-11-09 DIAGNOSIS — S82422D Displaced transverse fracture of shaft of left fibula, subsequent encounter for closed fracture with routine healing: Secondary | ICD-10-CM

## 2019-11-10 ENCOUNTER — Ambulatory Visit: Admit: 2019-11-10 | Discharge: 2019-11-10

## 2019-11-10 ENCOUNTER — Encounter: Admit: 2019-11-10 | Discharge: 2019-11-10

## 2019-11-10 ENCOUNTER — Ambulatory Visit: Admit: 2019-11-10 | Discharge: 2019-11-10 | Payer: BC Managed Care – PPO

## 2019-11-10 DIAGNOSIS — S92902D Unspecified fracture of left foot, subsequent encounter for fracture with routine healing: Secondary | ICD-10-CM

## 2019-11-10 DIAGNOSIS — M542 Cervicalgia: Secondary | ICD-10-CM

## 2019-11-10 DIAGNOSIS — M47812 Spondylosis without myelopathy or radiculopathy, cervical region: Secondary | ICD-10-CM

## 2019-11-10 DIAGNOSIS — M4850XA Collapsed vertebra, not elsewhere classified, site unspecified, initial encounter for fracture: Secondary | ICD-10-CM

## 2019-11-10 DIAGNOSIS — S82422D Displaced transverse fracture of shaft of left fibula, subsequent encounter for closed fracture with routine healing: Secondary | ICD-10-CM

## 2019-11-10 DIAGNOSIS — M419 Scoliosis, unspecified: Secondary | ICD-10-CM
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
## 2019-11-26 ENCOUNTER — Encounter: Admit: 2019-11-26 | Discharge: 2019-11-26

## 2019-11-26 NOTE — Telephone Encounter
Pre Visit Planning - New Patient    Affected area of spine: neck    Referred by:     Imaging: MRI [facility]    Previous spine surgery: none    Spinal Injections: none    EMG/NCS. none    Records Requested: Yes    Orders have been pended

## 2019-11-29 ENCOUNTER — Encounter: Admit: 2019-11-29 | Discharge: 2019-11-29

## 2019-11-30 ENCOUNTER — Ambulatory Visit: Admit: 2019-11-30 | Discharge: 2019-11-30

## 2019-11-30 ENCOUNTER — Encounter: Admit: 2019-11-30 | Discharge: 2019-11-30

## 2019-11-30 ENCOUNTER — Ambulatory Visit: Admit: 2019-11-30 | Discharge: 2019-11-30 | Payer: BC Managed Care – PPO

## 2019-11-30 DIAGNOSIS — M4850XA Collapsed vertebra, not elsewhere classified, site unspecified, initial encounter for fracture: Secondary | ICD-10-CM

## 2019-11-30 DIAGNOSIS — M542 Cervicalgia: Secondary | ICD-10-CM

## 2019-11-30 DIAGNOSIS — M5136 Other intervertebral disc degeneration, lumbar region: Secondary | ICD-10-CM

## 2019-11-30 DIAGNOSIS — M5030 Other cervical disc degeneration, unspecified cervical region: Secondary | ICD-10-CM

## 2019-11-30 DIAGNOSIS — M419 Scoliosis, unspecified: Secondary | ICD-10-CM

## 2019-11-30 DIAGNOSIS — I2699 Other pulmonary embolism without acute cor pulmonale: Secondary | ICD-10-CM

## 2019-11-30 DIAGNOSIS — M47812 Spondylosis without myelopathy or radiculopathy, cervical region: Secondary | ICD-10-CM

## 2019-11-30 DIAGNOSIS — S161XXA Strain of muscle, fascia and tendon at neck level, initial encounter: Secondary | ICD-10-CM

## 2019-11-30 NOTE — Progress Notes
SPINE CENTER HISTORY AND PHYSICAL    No chief complaint on file.                Vitals:    11/30/19 1318   BP: 120/83   BP Source: Arm, Left Upper   Patient Position: Sitting   Pulse: 98   Resp: 18   Temp: 36.9 ?C (98.5 ?F)   TempSrc: Oral   SpO2: 99%   Weight: 83.8 kg (184 lb 12.8 oz)   Height: 190.5 cm (75)   PainSc: Five        Medical History:   Diagnosis Date   ? Degenerative disc disease, lumbar 2012   ? DJD (degenerative joint disease) of cervical spine    ? Pulmonary embolism [facility]) September 18 2018    After surgery on left leg   ? Scoliosis    ? Spinal compression fracture [facility])          Surgical History:   Procedure Laterality Date   ? ABDOMINAL HERNIA REPAIR  2016   ? OPEN REPAIR INGUINAL HERNIA AGE 34 YEARS OR OLDER - REDUCIBLE - WITH MESH PLACEMENT - RIGHT Right 10/07/2017    Performed by Janine Ores, DO at [facility] OR   ? DEBRIDEMENT AND CLOSURE WOUND DEEP 20 SQ CM OR LESS - LOWER EXTREMITY, Left 08/06/2018    Performed by Heddings, Revonda Standard, MD at [facility] OR         No Known Allergies      Current Outpatient Medications on File Prior to Visit   Medication Sig Dispense Refill   ? rivaroxaban (XARELTO) 20 mg tablet Take one tablet by mouth daily. Take with food.  Indications: blood clot in a deep vein of the extremities 67 tablet 0     No current facility-administered medications on file prior to visit.          family history includes Cancer in his paternal grandfather; Diabetes in his father, mother, and paternal grandfather; Lymphadenopathy in an other family member.      Social History     Socioeconomic History   ? Marital status: Single     Spouse name: Not on file   ? Number of children: Not on file   ? Years of education: Not on file   ? Highest education level: Not on file   Occupational History   ? Not on file   Tobacco Use   ? Smoking status: Current Every Day Smoker     Packs/day: 0.50     Years: 10.00     Pack years: 5.00     Types: Cigarettes   ? Smokeless tobacco: Never Used   Substance and Sexual Activity   ? Alcohol use: Yes     Alcohol/week: 3.0 standard drinks     Types: 3 Cans of beer per week     Frequency: 2-4 times a month     Comment: social   ? Drug use: Yes     Types: Marijuana, Methamphetamines   ? Sexual activity: Yes     Partners: Female   Other Topics Concern   ? Not on file   Social History Narrative    ** Merged History Encounter **              Review of Systems   Constitutional: Negative.    HENT: Negative.    Eyes: Negative.    Respiratory: Negative.    Cardiovascular: Positive for leg swelling.   Gastrointestinal: Negative.  Endocrine: Negative.    Genitourinary: Negative.    Musculoskeletal: Negative.    Skin: Negative.    Allergic/Immunologic: Negative.    Neurological: Negative.    Hematological: Negative.    Psychiatric/Behavioral: Negative.           HPI / PHYSICAL EXAM / RADIOGRAPHIC EVALUATION /  IMPRESSION / PLAN      Dictation on: 11/30/2019  2:25 PM by: Wyona Almas [[email or web address]]                Total time 40 minutes.

## 2019-12-02 ENCOUNTER — Encounter: Admit: 2019-12-02 | Discharge: 2019-12-02

## 2024-08-03 ENCOUNTER — Encounter: Admit: 2024-08-03 | Discharge: 2024-08-03

## 2024-08-03 DIAGNOSIS — S82422D Displaced transverse fracture of shaft of left fibula, subsequent encounter for closed fracture with routine healing: Principal | ICD-10-CM

## 2024-08-04 ENCOUNTER — Encounter: Admit: 2024-08-04 | Discharge: 2024-08-04
